# Patient Record
Sex: Male | Born: 1940 | Race: White | Hispanic: No | Marital: Married
Health system: Southern US, Community
[De-identification: ages and names within clinical notes are randomized; demographics above are authoritative.]

## PROBLEM LIST (undated history)

## (undated) DIAGNOSIS — K219 Gastro-esophageal reflux disease without esophagitis: Secondary | ICD-10-CM

## (undated) DIAGNOSIS — G4730 Sleep apnea, unspecified: Secondary | ICD-10-CM

## (undated) DIAGNOSIS — I10 Essential (primary) hypertension: Secondary | ICD-10-CM

## (undated) DIAGNOSIS — F32A Depression, unspecified: Secondary | ICD-10-CM

## (undated) DIAGNOSIS — E119 Type 2 diabetes mellitus without complications: Secondary | ICD-10-CM

## (undated) DIAGNOSIS — F329 Major depressive disorder, single episode, unspecified: Secondary | ICD-10-CM

## (undated) DIAGNOSIS — E785 Hyperlipidemia, unspecified: Secondary | ICD-10-CM

## (undated) HISTORY — PX: TONSILLECTOMY AND ADENOIDECTOMY: SUR1326

## (undated) HISTORY — DX: Gastro-esophageal reflux disease without esophagitis: K21.9

## (undated) HISTORY — DX: Major depressive disorder, single episode, unspecified: F32.9

## (undated) HISTORY — DX: Depression, unspecified: F32.A

## (undated) HISTORY — DX: Essential (primary) hypertension: I10

## (undated) HISTORY — DX: Sleep apnea, unspecified: G47.30

## (undated) HISTORY — DX: Type 2 diabetes mellitus without complications: E11.9

## (undated) HISTORY — PX: VASECTOMY: SHX75

## (undated) HISTORY — PX: LAPAROSCOPIC GASTRIC BANDING: SHX1100

## (undated) HISTORY — PX: JOINT REPLACEMENT: SHX530

## (undated) HISTORY — DX: Hyperlipidemia, unspecified: E78.5

## (undated) HISTORY — PX: CHOLECYSTECTOMY: SHX55

---

## 2006-06-27 ENCOUNTER — Ambulatory Visit: Payer: Self-pay | Admitting: Cardiology

## 2007-02-23 ENCOUNTER — Inpatient Hospital Stay (HOSPITAL_COMMUNITY): Admission: RE | Admit: 2007-02-23 | Discharge: 2007-02-26 | Payer: Self-pay | Admitting: Orthopedic Surgery

## 2010-06-26 NOTE — Discharge Summary (Signed)
NAMEMarland Kitchen  Payne, Gary NO.:  [id number]   MEDICAL RECORD NO.:  [id number]          PATIENT TYPE:  INP   LOCATION:  1616                         FACILITY:  [facility]   PHYSICIAN:  Gary Payne, M.D.    DATE OF BIRTH:  November 13, 1940   DATE OF ADMISSION:  02/23/2007  DATE OF DISCHARGE:  02/26/2007                               DISCHARGE SUMMARY   ADMISSION DIAGNOSES:  1. Osteoarthritis right knee.  2. Migraines.  3. Anxiety.  4. Depression.  5. Hypertension.  6. Hypercholesterolemia.  7. Hiatal hernia.  8. Reflux.  9. History of ulcerations.  10.Hemorrhoids.  11.Non-insulin-dependent diabetes mellitus.   DISCHARGE DIAGNOSES:  1. Osteoarthritis right knee status post right total knee      arthroplasty.   1. Hyperkalemia, improved.  2. Mild hyponatremia, stable.  3. Mild postoperative blood loss anemia, did not require transfusion.  4. Migraines.  5. Anxiety.  6. Depression.  7. Hypertension.  8. Hypercholesterolemia.  9. Hiatal hernia.  10.Reflux.  11.History of ulcerations.  12.Hemorrhoids.  13.Non-insulin-dependent diabetes mellitus.   PROCEDURE:  Right total knee.  Surgeon Dr. Lequita Payne, assistant Gary Girt PA-  C.  Anesthesia spinal.   CONSULTATIONS:  None.   BRIEF HISTORY:  The patient is a 70 year old male well-known to Dr.  Ollen Payne with known arthritis in the right knee refractory to  nonoperative management and now presents for total knee arthroplasty.   LABORATORY DATA:  Preop CBC showed hemoglobin 12.5, hematocrit 36.1,  white cell count 10.2, platelets 232.  Chem panel on admission minimally  elevated glucose of 121.  Remaining Chem panel all within normal limits.  PT/INR preop 12.8 and 0.9 with a PTT of 27.  Preop UA negative.  Serial  CBCs were followed.  Hemoglobin did drop down to 10.7 and then 9.8 last  noted at 9 with a crit of 26.4.  Serial protimes followed.  Last PT/INR  22.8 and 1.9.  Serial B mets were followed.  Potassium did  go up from a  level of normal 4.6 preop to 5.6 postop on day one.  No obvious  hemolysis in the specimen was documented but was on potassium-sparing  medication that was held.  Potassium came back down to normal level 4.2.  Glucose went up to 155 back down to 123.   X-RAYS:  Chest X-Ray: February 18, 2007 no acute findings.   EKG Jun 19, 2006 normal P axis, sinus rhythm.   HOSPITAL COURSE:  The patient was admitted to [facility] tolerated  the procedure well, later transferred to recover room orthopedic floor,  started on PCA and p.o. analgesics.  He had fair amount of pain  throughout the night, doing well bit better on the morning of day one.  We increased PCA up to full dose which gave him better pain control.  Encouraged p.o.'s.  He had a little bit of low urinary output the next  morning, so we give him a fluid challenge.  The blood pressure was a  little soft postop so we held his enalapril, put his metoprolol on  parameters if the  pressure was any lower, resumed his Prevacid, started  back on his metformin and he was on sliding scale insulin postop for  tighter control until he was eating and drinking well.  He was noted to  have elevated potassium on day one, felt to be a combination of the  spironolactone and having a high normal potassium preop and just went up  a little bit.  We did hold his spirolactone and we did give him fluids.  With combination of the two potassium came back down to normal level.  His output did improve by the next day.  He started getting up out of  bed with PT.  Dressing was changed on day two.  Incision looked  excellent.  Started walking about 10-15 feet.  It was felt due to his  progression that he may need some type of skilled rehab.  We got social  work involved.  His output improved.  Hemoglobin dropped a little bit  down to 9.8, potassium came back down to a normal level.   DISCHARGE/PLAN:  Child psychotherapist got involved.  He had much better  pain  control on day two, weaned over to p.m. meds.  Bed was sought and by the  following day on postop day #3 a bed was notified or available at  [facility] skilled nursing facility.  The patient was in agreement.  He  was seen on rounds on day three, stable, doing well, and was discharged  to [facility].   DISCHARGE/PLAN:  1. The patient is further [place] skilled nursing facility.  2. Discharge diagnoses:  Please see above.   DISCHARGE MEDICATIONS:  Current medications include:  1. Coumadin protocol.  Please titrate the Coumadin level for target      INR between 2 and 3 and he is to be on Coumadin for 3 weeks from      the day of surgery February 23, 2007.  2. Colace 100 mg p.o. b.i.d.  3. Spironolactone 100 mg p.o. twice a day.  4. Metoprolol 50 mg p.o. q.h.s.  5. Prevacid 30 mg daily.  6. Hydrochlorothiazide 25 mg daily.  7. Glucophage 1000 mg p.o. daily.  8. Pravastatin 40 mg q.h.s.  9. Enalapril 10 mg p.o. b.i.d.  10.Tylenol one or two every 4-6 hours as needed for pain.  11.Robaxin 500 mg p.o. q. 6-8 hours p.r.n. spasm.  12.Percocet 5 mg one or two every 4-6 hours needed for pain.   DIET:  Low-sodium, heart-healthy, diet diabetic diet.   ACTIVITIES:  Weightbearing as tolerated right lower extremity.  PT and  OT for gait training ambulation, ADLs.  Needs to be out of bed daily  ambulating.  Continue range of motion and strength exercises for total  knee protocol.  Daily dressing change to the knee.  He may start  showering after discharge to [facility] skilled nursing facility, however,  do not submerge the incision under water.  Daily dressing change after  shower.   FOLLOW-UP:  He needs follow up with Dr. Lequita Payne in the office 2 weeks  from the date of surgery.  Please contact the office at [phone] to help  arrange appointment for the patient and follow-up care.  He will see Dr.  Lequita Payne over at the [facility] of [facility].    DISPOSITION:  [place] skilled nursing facility.   CONDITION ON DISCHARGE:  Improving.      Gary Payne, P.A.C.      Gary Payne, M.D.  Electronically Signed  ALP/MEDQ  D:  02/26/2007  T:  02/26/2007  Job:  [id number]   cc:   Gary Payne, M.D.  Fax: [phone]   Wyvonnia Lora  Fax: [phone]

## 2010-06-26 NOTE — Op Note (Signed)
NAME:  Gary, Payne NO.:  [id number]   MEDICAL RECORD NO.:  [id number]          PATIENT TYPE:  INP   LOCATION:  0004                         FACILITY:  [facility]   PHYSICIAN:  Ollen Gross, M.D.    DATE OF BIRTH:  [birth year]   DATE OF PROCEDURE:  02/23/2007  DATE OF DISCHARGE:                               OPERATIVE REPORT   PREOPERATIVE DIAGNOSIS:  Osteoarthritis, right knee.   POSTOPERATIVE DIAGNOSIS:  Osteoarthritis, right knee.   PROCEDURE:  Right total knee arthroplasty.   SURGEON:  Ollen Gross, M.D.   ASSISTANT:  Alexzandrew L. Perkins, P.A.C.   ANESTHESIA:  Spinal.   ESTIMATED BLOOD LOSS:  Minimal.   DRAINS:  None.   TOURNIQUET TIME:  38 minutes at 300 mmHg.   COMPLICATIONS:  None.   DISPOSITION:  Stable to recovery.   INDICATIONS FOR PROCEDURE:  Gary Payne is a 70 year old male with end-stage  arthritis of the right knee with progressively worsening pain and  dysfunction.  He has failed nonoperative management and presents for  right total knee arthroplasty.   PROCEDURE IN DETAIL:  After successful administration of spinal  anesthetic, a tourniquet was placed high on his right thigh and right  lower extremity prepped and draped in the usual sterile fashion.  Extremity was wrapped in an Esmarch, knee flexed, and tourniquet  inflated to 300 mmHg.   Midline incision made with 10 blade through subcutaneous tissue to the  level of the extensor mechanism.  A fresh blade was used to make a  medial parapatellar arthrotomy.  Soft tissue over the proximal and  medial tibia was subperiosteally elevated to the joint line with the  knife into the semimembranosus bursa with a Cobb elevator.  Soft tissue  laterally was elevated, with attention being paid to avoiding the  patellar tendon on the tibial tubercle.  Patella subluxed laterally and  knee flexed 90 degrees.  ACL and PCL removed.  Drill was used to create  a starting hole in the distal femur, and  the canal was thoroughly  irrigated.  The 5-degree right valgus alignment guide was placed  referencing off the posterior condyles.  Rotation was marked and the  block pinned to remove 11 mm off the distal femur.  I took 11 because of  the preop flexion contracture.  Distal femoral resection was made with  an oscillating saw.  Sizing blocks placed.  Size 4 was the most  appropriate.  Rotation was marked off the epicondylar axis.  Size 4  cutting block was placed, and the anterior, posterior, and chamfer cuts  were made.   Tibia subluxed forward and menisci removed.  Extramedullary tibial  alignment guide was placed referencing proximally at the medial aspect  of the tibial tubercle and distally along the second metatarsal axis and  tibial crest.  The block was pinned to remove about 10 mm off the  nondeficient lateral side.  This did not get all the way down to the  bottom of the defect medially, so I took an additional 2 mm.  Tibial  resection was made with an  oscillating saw.  Size 5 was the most  appropriate tibial component.  Size 5 tray was placed, and the proximal  tibia was prepared with the modular drill and keel punch.  Femoral  preparation was completed with the intercondylar cut for the size 4.   Size 5 mobile bearing tibial trial, size 4 posterior stabilized femoral  trial, and a 12.5-mm posterior stabilized rotating platform insert trial  were placed.  With the 12.5, there was a little hyperextension, a little  laxity in the AP plane.  I went to a 15 which allowed for full extension  with excellent varus, valgus, anterior, and posterior balance throughout  full range of motion.  Patella was then everted and thickness measured  to be 22 mm.  Freehand resection was taken to 13 mm, a 38 template was  placed, lug holes were drilled, trial patella was placed, and it tracked  normally.  Osteophytes were then removed off the posterior femur with  the trials in place.  All trials  were removed, and the cut bone surfaces  were prepared with pulsatile lavage.  Cement was mixed, and once ready  for implantation, the size 5 mobile bearing tibial tray, size 4  posterior stabilized femur, and 38 patella were cemented into place.  Patella was held with a clamp.  Trial 15-mm insert was placed and the  knee held in full extension and all extruded cement removed.  When the  cement was fully hardened, then the permanent 15-mm posterior stabilized  rotating platform insert was placed into the tibial tray.  The wound was  copiously irrigated with saline solution.  FloSeal was injected in the  medial and lateral gutters, suprapatellar area, and posterior capsule.  Moist sponge was placed, and the tourniquet was released for a total  time of 38 minutes.  The sponge was held for 2 minutes, removed, and  then minimal bleeding was encountered.  Bleeding that was encountered  was stopped with electrocautery.  The wounds were again copiously  irrigated with saline solution and the extensor mechanism closed with  interrupted #1 PDS.  Flexion against gravity was 135 degrees.  The subcu  was closed with interrupted 2-0 Vicryl and subcuticular running 4-0  Monocryl.  The incision was cleaned and dried, and Steri-Strips and a  bulky sterile dressing were applied.   He was then placed into a knee immobilizer, awakened, and transported to  recovery in stable condition.      Ollen Gross, M.D.  Electronically Signed     FA/MEDQ  D:  02/23/2007  T:  02/23/2007  Job:  [id number]

## 2010-06-29 NOTE — H&P (Signed)
NAME:  Gary [facility], Gary Payne NO.:  [id number]   MEDICAL RECORD NO.:  [id number]          PATIENT TYPE:  INP   LOCATION:  0004                         FACILITY:  [facility]   PHYSICIAN:  Ollen Gross, M.D.    DATE OF BIRTH:  December 02, 1940   DATE OF ADMISSION:  02/23/2007  DATE OF DISCHARGE:                              HISTORY & PHYSICAL   CHIEF COMPLAINT:  Right knee pain.   HISTORY OF PRESENT ILLNESS:  The patient is a 70 year old male who has  seen by Dr. Lequita Halt for ongoing knee pain, seen in second opinion, known  end-stage arthritis.  He had his left knee replaced by Dr. Lorn Junes in  [place] and did well with that, but since that time, Dr. Lorn Junes has left  [place].  The patient had been seen by Dr. Edger House and referred over to  Dr. Lequita Halt as a second opinion.  He was found to have end-stage  arthritis of the right knee, ongoing for quite some time now.  He has  reached the point where he would benefit from undergoing surgical  intervention.  He was found to have significant arthritis and felt to be  a good candidate and subsequently admitted to the hospital.   ALLERGIES:  PENICILLIN.   CURRENT MEDICATIONS:  Spironolactone, enalapril, metoprolol, Prevacid,  hydrochlorothiazide, Glucophage, pravastatin, baby aspirin, Centrum  Silver vitamin.   PAST MEDICAL HISTORY:  1. Migraines.  2. Anxiety.  3. Depression.  4. Hypertension.  5. Hypercholesterolemia.  6. Hiatal hernia.  7. Reflux disease.  8. Past history of gastric ulcers.  9. Hemorrhoids.  10.Non-insulin-dependent diabetes mellitus.  11.History of staph infection which was superficial on his hand.  12.Childhood illnesses to include measles, mumps, rubella.   PAST SURGICAL HISTORY:  \  1. Tonsils and adenoids in 1958.  2. Vasectomy.  3. Gastric band surgery in 2002.  4. Cholecystectomy.  5. Left total knee.   SOCIAL HISTORY:  Married, retired.  Nonsmoker.  No alcohol.  Three  children.  Family will be  assisting with care after surgery.   FAMILY HISTORY:  Father deceased at age 41 with a heart attack.  Mother  deceased at age 57 with a blood clot on her brainstem.  Brother  deceased at age 70 secondary to heart attack.  He has three sisters  living, age 41, 63, and 37.  Three children living, two sons, age 8 and  49, and a daughter age 63.   REVIEW OF SYSTEMS:  GENERAL:  No fevers, chills, night sweats.  NEUROLOGIC:  No seizures, syncope, or paralysis.  RESPIRATORY:  No  shortness breath, productive cough, or hemoptysis.  CARDIOVASCULAR:  No  chest pain, angina, orthopnea.  GI:  No nausea, vomiting, diarrhea, or  constipation.  GU:  A little bit of nocturia.  No dysuria or  hematuria.  MUSCULOSKELETAL:  Joint pain, morning stiffness with the right knee.   PHYSICAL EXAMINATION:  VITAL SIGNS:  Pulse 68, respirations 12, blood  pressure 124/64.  GENERAL:  A 70 year old white male, well-nourished, well-developed,  overweight, obese, although he has lost 120 pounds.  He is alert,  oriented, and cooperative. Excellent historian.  Accompanied by his  wife.  HEENT:  Normocephalic, atraumatic.  Pupils equal, round, and reactive.  Oropharynx clear.  EOMs intact.  NECK:  Supple.  CHEST:  Clear.  HEART:  Regular rate and rhythm.  No murmur.  ABDOMEN:  Large protuberant abdomen.  Bowel sounds present, although  faint.  RECTAL/BREASTS/GENITALIA:  Not done, not pertinent to present illness.  EXTREMITIES:  Right knee significant varus malalignment deformity.  No  effusion, marked crepitus.  Range of motion 10-100.  No instability.   IMPRESSION:  1. Osteoarthritis, right knee.  2. Migraines.  3. Anxiety.  4. Depression.  5. Hypertension.  6. Hypercholesterolemia.  7. Hiatal hernia.  8. Reflux disease.  9. History of ulcerations.  10.Hemorrhoids.  11.Non-insulin-dependent diabetes mellitus.   PLAN:  The patient admitted to [facility] to undergo right  total knee replacement  arthroplasty.  Surgery will be performed by Ollen Gross.  He has been seen by Dr. Wyvonnia Lora preoperatively and felt  to be cleared for upcoming surgery.      Alexzandrew L. Perkins, P.A.C.      Ollen Gross, M.D.  Electronically Signed    ALP/MEDQ  D:  02/22/2007  T:  02/23/2007  Job:  [id number]   cc:   Wyvonnia Lora  Fax: [phone]   Helayne Seminole, M.D.   Ollen Gross, M.D.  Fax: [phone]

## 2010-11-01 LAB — URINALYSIS, ROUTINE W REFLEX MICROSCOPIC
Nitrite: NEGATIVE
Protein, ur: NEGATIVE
Urobilinogen, UA: 1

## 2010-11-01 LAB — CBC
HCT: 28.3 — ABNORMAL LOW
HCT: 30.8 — ABNORMAL LOW
Hemoglobin: 9.8 — ABNORMAL LOW
Hemoglobin: 9 — ABNORMAL LOW
MCHC: 33.9
MCV: 88.5
Platelets: 223
Platelets: 232
RBC: 2.98 — ABNORMAL LOW
WBC: 10.2
WBC: 15.2 — ABNORMAL HIGH
WBC: 17.7 — ABNORMAL HIGH
WBC: 12.3 — ABNORMAL HIGH

## 2010-11-01 LAB — BASIC METABOLIC PANEL
CO2: 25
Chloride: 103
Chloride: 103
Creatinine, Ser: 1.43
GFR calc non Af Amer: 42 — ABNORMAL LOW
GFR calc non Af Amer: 48 — ABNORMAL LOW
Glucose, Bld: 155 — ABNORMAL HIGH
Glucose, Bld: 123 — ABNORMAL HIGH
Potassium: 4.7
Potassium: 4.9
Potassium: 4.2
Sodium: 133 — ABNORMAL LOW
Sodium: 133 — ABNORMAL LOW
Sodium: 133 — ABNORMAL LOW

## 2010-11-01 LAB — ABO/RH: ABO/RH(D): A POS

## 2010-11-01 LAB — PROTIME-INR
INR: 1.1
INR: 1.6 — ABNORMAL HIGH
INR: 1.9 — ABNORMAL HIGH
Prothrombin Time: 14.3

## 2010-11-01 LAB — TYPE AND SCREEN

## 2010-11-01 LAB — COMPREHENSIVE METABOLIC PANEL
ALT: 18
AST: 26
CO2: 25
Calcium: 9.4
Chloride: 106
GFR calc non Af Amer: 49 — ABNORMAL LOW
Potassium: 4.6
Sodium: 138
Total Bilirubin: 0.6

## 2010-11-01 LAB — APTT: aPTT: 27

## 2014-06-16 ENCOUNTER — Other Ambulatory Visit: Payer: Self-pay | Admitting: *Deleted

## 2014-06-16 DIAGNOSIS — R112 Nausea with vomiting, unspecified: Secondary | ICD-10-CM

## 2014-06-16 DIAGNOSIS — Z9884 Bariatric surgery status: Secondary | ICD-10-CM

## 2014-06-20 ENCOUNTER — Other Ambulatory Visit: Payer: Self-pay

## 2014-06-24 ENCOUNTER — Ambulatory Visit
Admission: RE | Admit: 2014-06-24 | Discharge: 2014-06-24 | Disposition: A | Discharge status: Home / Self Care | Payer: Medicare Other | Source: Ambulatory Visit | Attending: Physician Assistant | Admitting: Physician Assistant

## 2015-03-16 ENCOUNTER — Other Ambulatory Visit (INDEPENDENT_AMBULATORY_CARE_PROVIDER_SITE_OTHER): Payer: Self-pay | Admitting: Physician Assistant

## 2015-03-16 DIAGNOSIS — Z9884 Bariatric surgery status: Secondary | ICD-10-CM

## 2015-03-20 ENCOUNTER — Ambulatory Visit
Admission: RE | Admit: 2015-03-20 | Discharge: 2015-03-20 | Disposition: A | Discharge status: Home / Self Care | Payer: Medicare Other | Source: Ambulatory Visit | Attending: Physician Assistant | Admitting: Physician Assistant

## 2015-03-20 DIAGNOSIS — Z9884 Bariatric surgery status: Secondary | ICD-10-CM

## 2015-04-28 ENCOUNTER — Other Ambulatory Visit: Payer: Self-pay | Admitting: General Surgery

## 2015-04-28 DIAGNOSIS — Z9884 Bariatric surgery status: Secondary | ICD-10-CM

## 2015-05-01 ENCOUNTER — Ambulatory Visit: Payer: Medicare Other | Admitting: Dietician

## 2015-05-10 ENCOUNTER — Other Ambulatory Visit: Payer: Medicare Other

## 2015-05-16 ENCOUNTER — Encounter: Payer: Medicare Other | Attending: General Surgery | Admitting: Dietician

## 2015-05-16 ENCOUNTER — Encounter: Payer: Self-pay | Admitting: Dietician

## 2015-05-16 ENCOUNTER — Ambulatory Visit
Admission: RE | Admit: 2015-05-16 | Discharge: 2015-05-16 | Disposition: A | Discharge status: Home / Self Care | Payer: Medicare Other | Source: Ambulatory Visit | Attending: General Surgery | Admitting: General Surgery

## 2015-05-16 ENCOUNTER — Other Ambulatory Visit: Payer: Self-pay | Admitting: General Surgery

## 2015-05-16 VITALS — Ht 67.0 in | Wt 347.9 lb

## 2015-05-16 DIAGNOSIS — Z9884 Bariatric surgery status: Secondary | ICD-10-CM

## 2015-05-16 DIAGNOSIS — Z6841 Body Mass Index (BMI) 40.0 and over, adult: Secondary | ICD-10-CM | POA: Diagnosis not present

## 2015-05-16 DIAGNOSIS — E119 Type 2 diabetes mellitus without complications: Secondary | ICD-10-CM | POA: Insufficient documentation

## 2015-05-16 NOTE — Progress Notes (Signed)
Follow-up visit:  15 years Post-Operative LAGB Surgery  Medical Nutrition Therapy:  Appt start time: 1050 end time:  1145.  Primary concerns today: Post-operative Bariatric Surgery Nutrition Management.  [facility] is here today with his wife, stating Dr. Andrey CampanileWilson wanted him to come. He had LAGB in 2002. Patient states that up until the past year he has had success with the band. Reports that his highest weight was 436 lbs prior to LAGB and his lowest weight post op was 270 lbs. His weight today is 347.9 lbs. Gary Payne reports that last year he started vomiting more frequently and could not keep any food or liquid down. Began seeing Bary Richardndy Liepens [facility] back in the fall of 2016 and fluid was removed from band. The patient states he does not have any fluid in band currently. Vomiting has resolved and patient notes regular bowel movements. Patient states he plans to follow up with Dr. Andrey CampanileWilson next week to discuss either adding fluid back to band or surgical revision to RYGB. 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 Reports that he gets very thirsty in the evenings. He has type 2 diabetes but he does not test his blood sugars. He reports his hemoglobin became very low a few months ago and he had to have an infusion.    Preferred Learning Style:   No preference indicated   Learning Readiness:   Ready  24-hr recall: Wakes up around 9-9:30 B (10AM): toast with jelly and coffee OR oatmeal with raisins Snk (AM):   L (PM): fruit or cheese and crackers Snk (PM):   D (PM): cream soup with hotdog or spam  Snk (PM): fruit, lots of water  Does not like raw onions, pickles, yogurt, most fish and shellfish.  Tolerates most foods.    Fluid intake: 64 oz water, sweet tea, coffee with flavored creamer, regular soda occasionally   Medications: see list Supplementation: no multivitamin, vitamin D3 only  CBG monitoring: does not  test Last patient reported A1c: 7.5%  Drinking while eating: sometimes Carbonated beverages: yes N/V/D/C: none  Recent physical activity:  none  Progress Towards Goal(s):  In progress.  Handouts given during visit include:  Phase 3A lean proteins  Chair exercises   Nutritional Diagnosis:  [place]-3.4 Unintentional weight gain As related to excessive calorie and carbohydrate intake due to difficulty with lap band.  As evidenced by patient reports recent significant weight gain.    Intervention:  Nutrition counseling provided. Reviewed bariatric diet recommendations. Encouraged patient to reduce carbohydrates and increase dietary protein for weight reduction and blood sugar management. Discussed methods for increasing physical activity.   Goals: -Increase protein foods throughout the day: beans, nuts, eggs, cheese, chili, burger patties -Have a protein food with each meal and snack -Try low fat cream soups with chicken or beef in crockpot -Work on avoiding starchy foods and fried foods -When you cook make extra for leftovers! -Pre portion foods and eat them slowly -Pay attention to serving size -Work on being more active overall  -Every time you go to the bathroom, pick a different activity to do while you're up (leg lifts, laundry, arm exercise)  -Deck of cards workout  -Start taking a vitamin daily -Try chewable Flintstones complete or Centrum with iron  Teaching Method Utilized:  Visual Auditory  Barriers to learning/adherence to lifestyle change: food preferences  Demonstrated degree of understanding via:  Teach Back   Monitoring/Evaluation:  Dietary  intake, exercise, lap band fills, and body weight. Follow up prn.

## 2015-05-16 NOTE — Patient Instructions (Addendum)
-  Increase protein foods throughout the day: beans, nuts, eggs, cheese, chili, burger patties -Have a protein food with each meal and snack  -Try low fat cream soups with chicken or beef in crockpot  -Work on avoiding starchy foods and fried foods  -When you cook make extra for leftovers!  -Pre portion foods and eat them slowly -Pay attention to serving size  -Work on being more active overall  -Every time you go to the bathroom, pick a different activity to do while you're up (leg lifts, laundry, arm exercise)  -Deck of cards workout   -Start taking a vitamin daily -Try chewable Flintstones complete or Centrum with iron

## 2015-05-25 ENCOUNTER — Ambulatory Visit: Payer: Medicare Other | Admitting: Dietician

## 2016-10-11 IMAGING — RF DG UGI W/ KUB
14 of 18 series · 17 of 24 positions shown · non-contrast
Comparison: Upper GI of 03/20/2015

CLINICAL DATA: History of lap band, patient weighs over 300 pound

EXAM:
UPPER GI SERIES WITH KUB
TECHNIQUE: After obtaining a scout radiograph a routine upper GI series was
performed using thin barium
FLUOROSCOPY TIME:  Radiation Exposure Index (as provided by the
fluoroscopic device): 176 deciGy per square cm
If the device does not provide the exposure index:
Fluoroscopy Time (in minutes and seconds):  1 minutes 24 seconds
Number of Acquired Images:

[Series 1: run · 1 of 6 slices shown (1 of 13)]
[im 1/6]
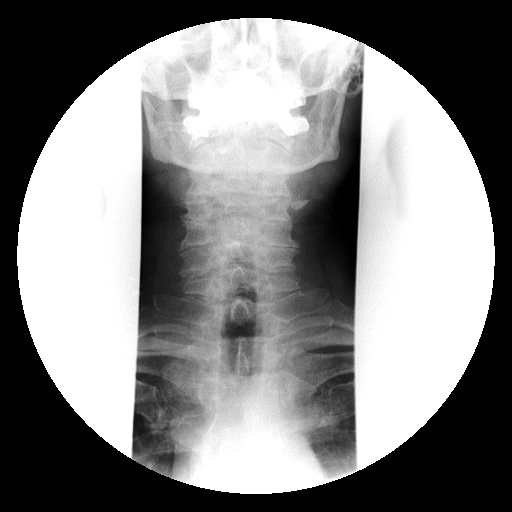

[Series 2: run · 4 of 15 slices shown (2 of 13)]
[im 3/15]
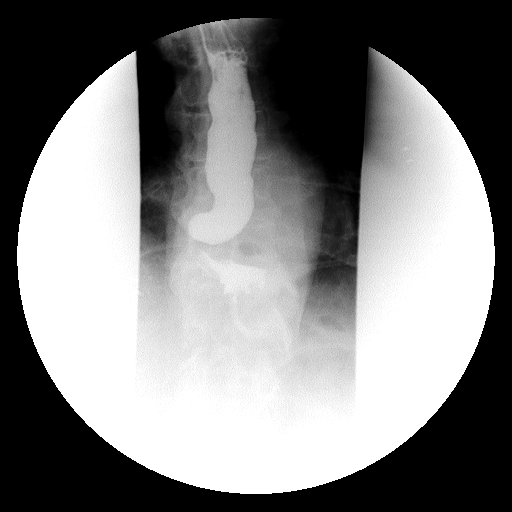
[im 6/15]
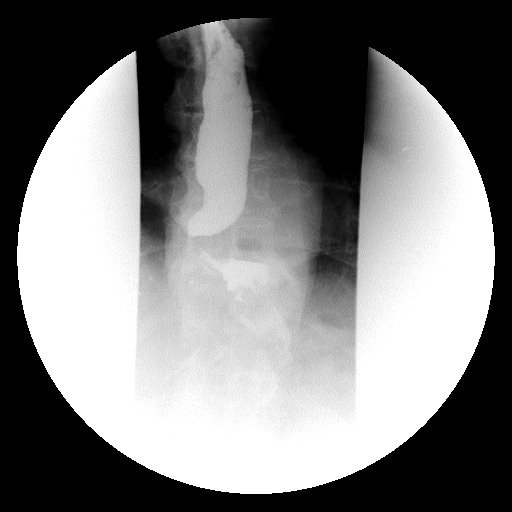
[im 9/15]
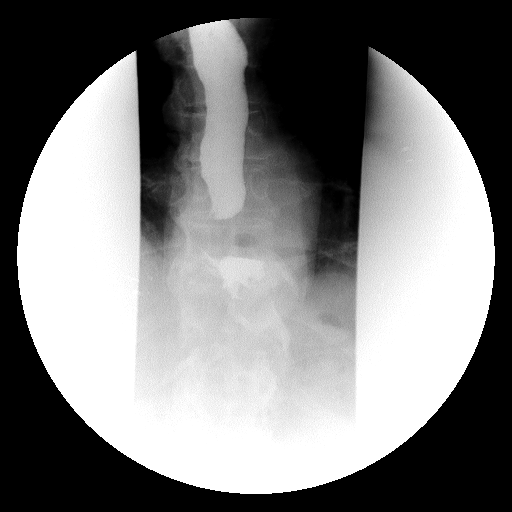
[im 15/15]
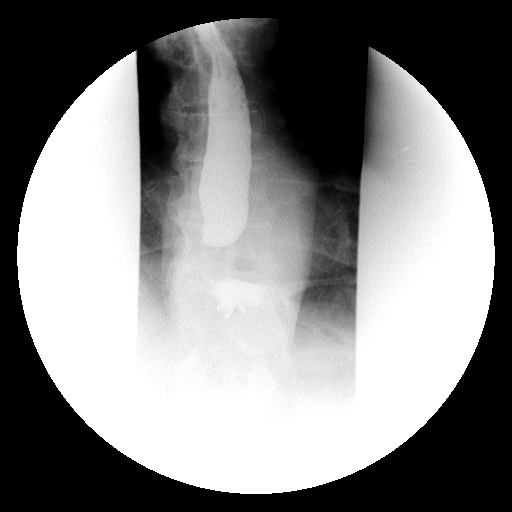

[Series 3: run · 1 of 8 slices shown (3 of 13)]
[im 1/8]
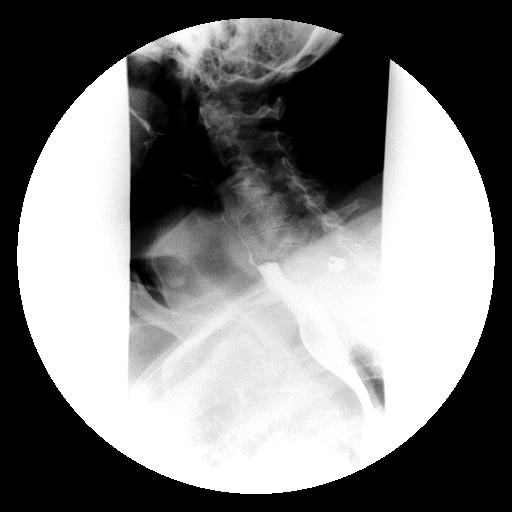

[Series 4: run · 1 of 1 slices shown (4 of 13)]
[im 1/1]
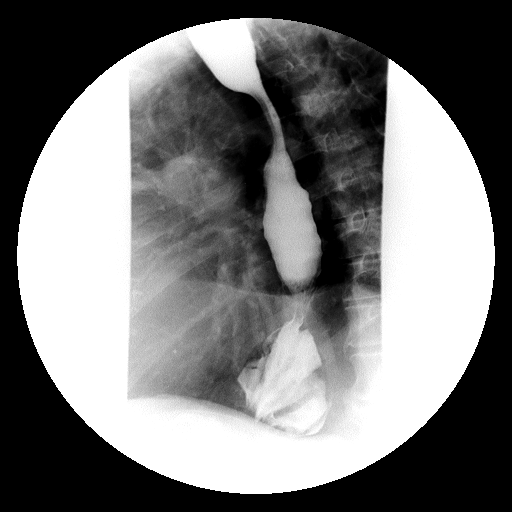

[Series 5: run · 1 of 1 slices shown (5 of 13)]
[im 1/1]
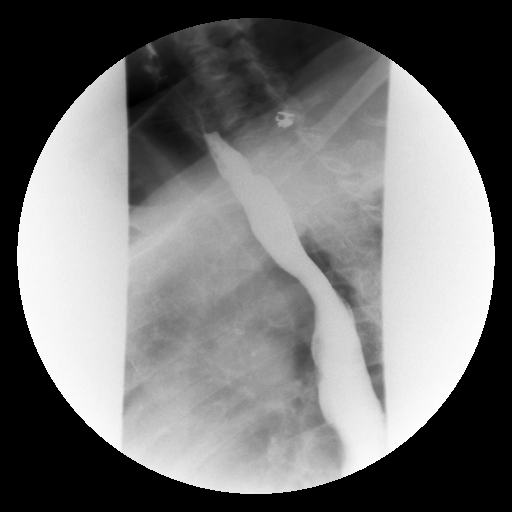

[Series 7: run · 1 of 1 slices shown (6 of 13)]
[im 1/1]
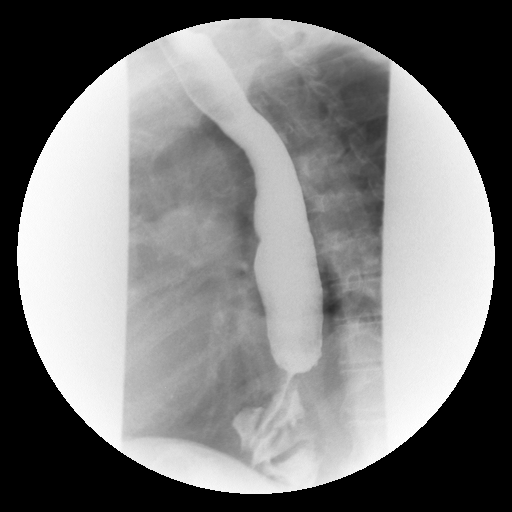

[Series 8: run · 1 of 1 slices shown (7 of 13)]
[im 1/1]
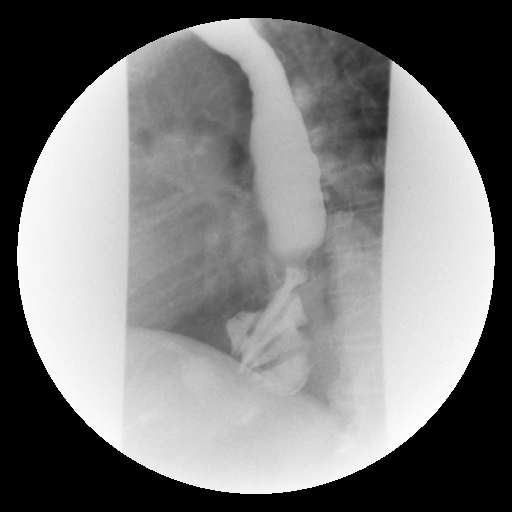

[Series 9: run · 1 of 1 slices shown (8 of 13)]
[im 1/1]
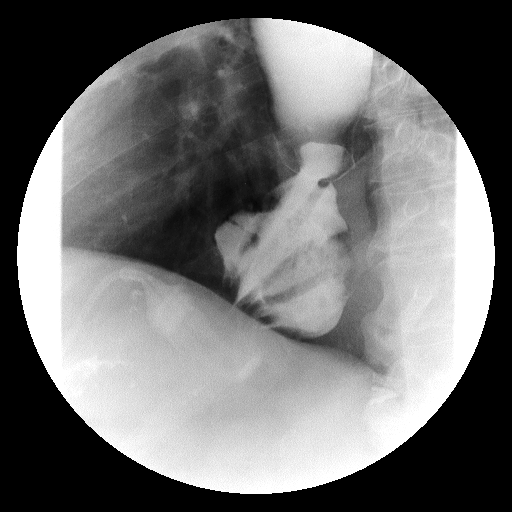

[Series 11: run · 1 of 1 slices shown (9 of 13)]
[im 1/1]
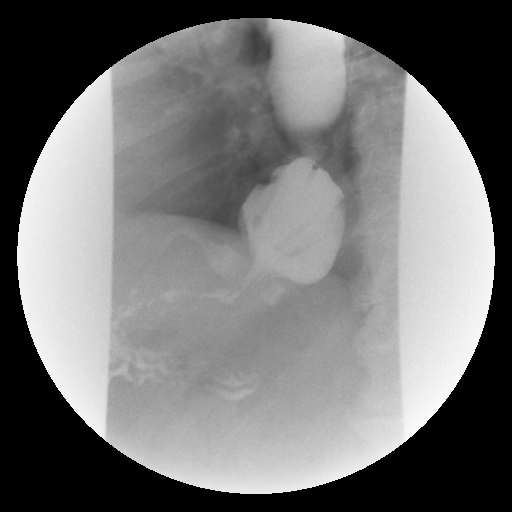

[Series 12: run · 1 of 1 slices shown (10 of 13)]
[im 1/1]
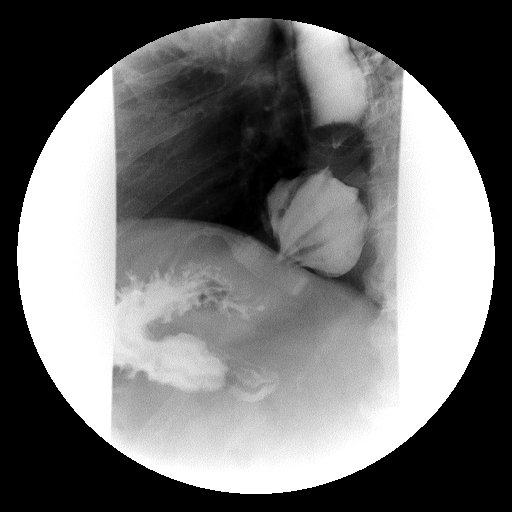

[Series 14: run · 1 of 1 slices shown (11 of 13)]
[im 1/1]
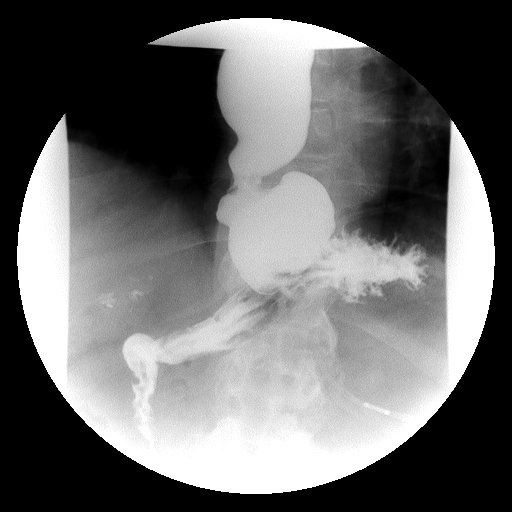

[Series 15: run · 1 of 1 slices shown (12 of 13)]
[im 1/1]
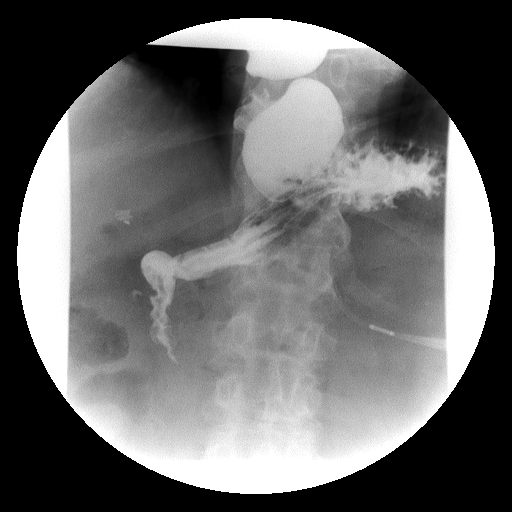

[Series 16: run · 1 of 1 slices shown (13 of 13)]
[im 1/1]
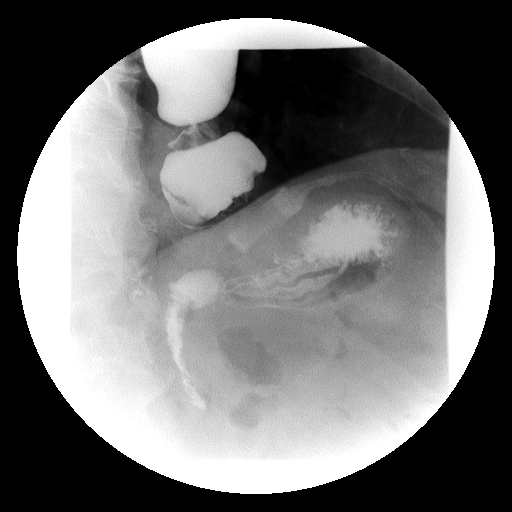

[Series 1001: view not recorded · 0.20mm/px · 1 of 1 slices shown]
[im 1/1]
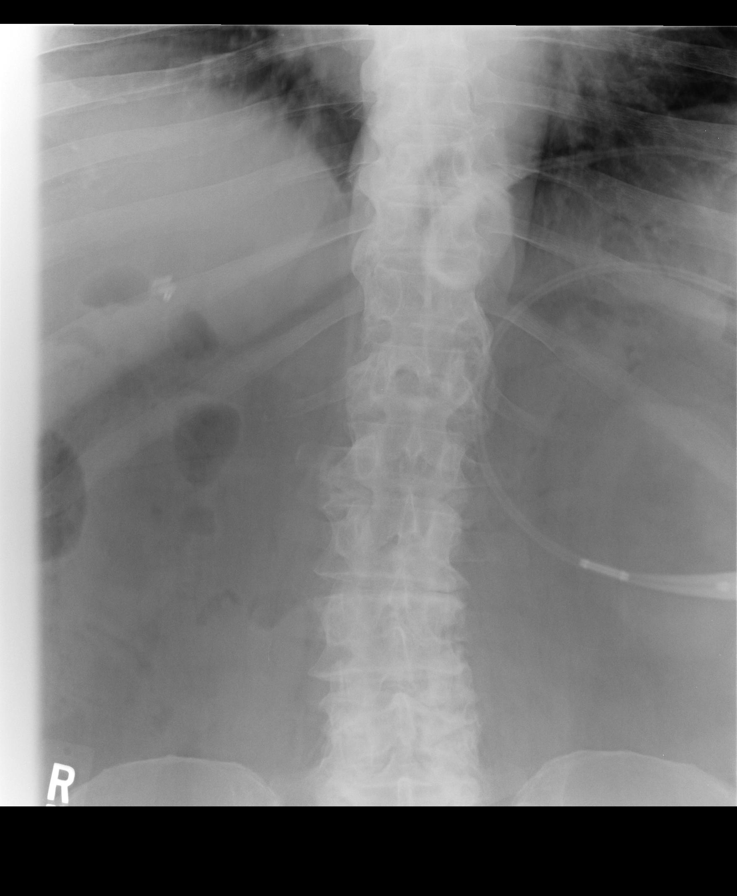

[17 of 24 positions shown; findings below may reference images not displayed]

FINDINGS: A preliminary film the abdomen shows a nonspecific bowel gas
pattern. Compared to the prior KUB, the lap band position does not
appear to change significantly, but a days abnormally positioned at
the level of the hemidiaphragm with lucency above suggesting
herniation of the gastric pouch. Otherwise the bowel gas pattern is
unremarkable. There are degenerative change throughout the mid to
lower lumbar spine.

Initially, rapid sequence spot films of the cervical esophagus were
performed showing the swallowing mechanism to be normal. Rapid
sequence spot films over the distal esophagus show some delay in
passage of barium. In the RAO position rapid sequence spot films of
the cervical esophagus show a normal swallowing mechanism. There is
dysmotility of the esophagus as noted previously. There is
herniation of the gastric pouch above the hemidiaphragm, similar to
the prior exam. Even in the erect position there is very slow
passage of barium through the narrowed lumen through the lap band
with suboptimal filling of the stomach with barium. Stomach is
grossly unremarkable. The duodenal bulb fills and the duodenal loop
is in normal position.
IMPRESSION: 1. Abnormal position of the lap band with herniation of the gastric
pouch above the lap band, unchanged compared to the prior study of
03/20/2015.
2. Esophageal dysmotility, with delayed passage of barium through
the narrowed lumen through the lap band into the stomach.
3. Poor distention of the stomach but no significant abnormality.
